# Patient Record
Sex: Female | Born: 2000 | Race: White | Hispanic: No | Marital: Single | Smoking: Current some day smoker
Health system: Southern US, Community
[De-identification: ages and names within clinical notes are randomized; demographics above are authoritative.]

## PROBLEM LIST (undated history)

## (undated) DIAGNOSIS — E119 Type 2 diabetes mellitus without complications: Secondary | ICD-10-CM

## (undated) DIAGNOSIS — F909 Attention-deficit hyperactivity disorder, unspecified type: Secondary | ICD-10-CM

## (undated) DIAGNOSIS — Z9229 Personal history of other drug therapy: Secondary | ICD-10-CM

## (undated) DIAGNOSIS — F419 Anxiety disorder, unspecified: Secondary | ICD-10-CM

## (undated) DIAGNOSIS — Z789 Other specified health status: Secondary | ICD-10-CM

---
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
## 2022-01-26 DIAGNOSIS — K219 Gastro-esophageal reflux disease without esophagitis: Secondary | ICD-10-CM | POA: Insufficient documentation

## 2022-02-06 NOTE — Progress Notes (Signed)
Sleep Medicine   Office Visit  Patient Name: Mariah Chavez DOB: 2000/09/03 MRN [id number]    Chief Complaint: Sleep consult  Brief History:  Milo presents for an initial consult for sleep evaluation and to establish care. Patient states that her sleep quality is poor due to sleeping with a wedge because of acid reflux. The patient's mother reports loud snoring at night. The patient relates the following symptoms: waking tired daily, snoring, morning headaches sometimes, brain fog, lack of focus, EDS, and afternoon fatigue are also present. The patient goes to sleep at 10:30-11:30pm and wakes up at 7:00am.  Sleep quality is worse when outside home environment.  Patient has noted no restlessness of her legs at night that would disrupt her sleep.  The patient  relates vivid dreams as unusual behavior during the night.  The patient relates anxiety, depression, ADHD, and PTSD as  a history of psychiatric problems. She is seen by therapist. The Epworth Sleepiness Score is 12 out of 24 .  The patient relates  Cardiovascular risk factors include: borderline elevated BP, but not on medication for HTN at this time. She reports her mom has OSA.   ROS  General: (-) fever, (-) chills, (-) night sweat Nose and Sinuses: (-) nasal stuffiness or itchiness, (-) postnasal drip, (-) nosebleeds, (-) sinus trouble. Mouth and Throat: (-) sore throat, (-) hoarseness. Neck: (-) swollen glands, (-) enlarged thyroid, (-) neck pain. Respiratory: - cough, - shortness of breath, - wheezing. Neurologic: - numbness, - tingling. Psychiatric: - anxiety, - depression Sleep behavior: -sleep paralysis -hypnogogic hallucinations -dream enactment      +vivid dreams -cataplexy -night terrors -sleep walking   Current Medication: Outpatient Encounter Medications as of 02/07/2022  Medication Sig   escitalopram (LEXAPRO) 10 MG tablet TAKE 1 TABLET BY MOUTH ONCE DAILY FOR 90 DAYS   esomeprazole (NEXIUM) 40 MG capsule Take 40  mg by mouth daily.   Melatonin 10 MG TABS Take by mouth.   traZODone (DESYREL) 100 MG tablet Take 100 mg by mouth at bedtime.   [DISCONTINUED] amoxicillin-clavulanate (AUGMENTIN) 875-125 MG per tablet Take 1 tablet by mouth every 12 (twelve) hours.   [DISCONTINUED] ciprofloxacin-dexamethasone (CIPRODEX) otic suspension Place 4 drops into the left ear 2 (two) times daily.   [DISCONTINUED] HYDROcodone-acetaminophen (NORCO/VICODIN) 5-325 MG per tablet Take 1 tablet by mouth every 6 (six) hours as needed for severe pain.   [DISCONTINUED] lisdexamfetamine (VYVANSE) 60 MG capsule Take 60 mg by mouth every morning.   [DISCONTINUED] metFORMIN (GLUCOPHAGE) 500 MG tablet 250 mg 2 (two) times daily with a meal.   [DISCONTINUED] sertraline (ZOLOFT) 25 MG tablet Take 25 mg by mouth daily.   No facility-administered encounter medications on file as of 02/07/2022.    Surgical History: History reviewed. No pertinent surgical history.  Medical History: Past Medical History:  Diagnosis Date   ADHD (attention deficit hyperactivity disorder)    Anxiety    Diabetes (HCC)    Immunizations up to date in pediatric patient     Family History: Non contributory to the present illness  Social History: Social History   Socioeconomic History   Marital status: Single    Spouse name: Not on file   Number of children: Not on file   Years of education: Not on file   Highest education level: Not on file  Occupational History   Not on file  Tobacco Use   Smoking status: Never   Smokeless tobacco: Not on file  Substance and Sexual Activity  Alcohol use: No   Drug use: Yes    Types: Marijuana   Sexual activity: Not on file  Other Topics Concern   Not on file  Social History Narrative   Not on file   Social Determinants of Health   Financial Resource Strain: Not on file  Food Insecurity: Not on file  Transportation Needs: Not on file  Physical Activity: Not on file  Stress: Not on file  Social  Connections: Not on file  Intimate Partner Violence: Not on file    Vital Signs: Blood pressure (!) 143/82, pulse 99, resp. rate 20, height 5\' 8"  (1.727 m), weight (!) 311 lb (141.1 kg), SpO2 97 %. Body mass index is 47.29 kg/m.   Examination: General Appearance: The patient is well-developed, well-nourished, and in no distress. Neck Circumference: 47cm Skin: Gross inspection of skin unremarkable. Head: normocephalic, no gross deformities. Eyes: no gross deformities noted. ENT: ears appear grossly normal Neurologic: Alert and oriented. No involuntary movements.    STOP BANG RISK ASSESSMENT S (snore) Have you been told that you snore?     YES   T (tired) Are you often tired, fatigued, or sleepy during the day?   YES  O (obstruction) Do you stop breathing, choke, or gasp during sleep? YES   P (pressure) Do you have or are you being treated for high blood pressure? NO   B (BMI) Is your body index greater than 35 kg/m? YES   A (age) Are you 20 years old or older? NO   N (neck) Do you have a neck circumference greater than 16 inches?   YES   G (gender) Are you a female? NO   TOTAL STOP/BANG "YES" ANSWERS 5                                                               A STOP-Bang score of 2 or less is considered low risk, and a score of 5 or more is high risk for having either moderate or severe OSA. For people who score 3 or 4, doctors may need to perform further assessment to determine how likely they are to have OSA.         EPWORTH SLEEPINESS SCALE:  Scale:  (0)= no chance of dozing; (1)= slight chance of dozing; (2)= moderate chance of dozing; (3)= high chance of dozing  Chance  Situtation    Sitting and reading: 3    Watching TV: 1    Sitting Inactive in public: 0    As a passenger in car: 2      Lying down to rest: 3    Sitting and talking: 0    Sitting quielty after lunch: 3    In a car, stopped in traffic: 0   TOTAL SCORE:   12 out of  24    SLEEP STUDIES:  None   LABS: No results found for this or any previous visit (from the past 2160 hour(s)).  Radiology: No results found.  No results found.  No results found.    Assessment and Plan: Patient Active Problem List   Diagnosis Date Noted   Mixed anxiety and depressive disorder 02/07/2022   Gastroesophageal reflux disease 01/26/2022   ADHD 10/01/2020   Insomnia 10/01/2020   Elevated BP without  diagnosis of hypertension 10/01/2020   Type 2 diabetes mellitus without complication, without long-term current use of insulin (HCC) 09/15/2020   Anxiety 09/24/2013     PLAN OSA:   Patient evaluation suggests high risk of sleep disordered breathing due to waking tired daily, snoring, Fhx of OSA, morning headaches sometimes, brain fog, lack of focus, excessive daytime sleepiness, afternoon fatigue, reflux, and elevated BMI.  Patient has comorbid cardiovascular risk factors including: elevated BP in office which could be exacerbated by pathologic sleep-disordered breathing.  Suggest: PSG to assess/treat the patient's sleep disordered breathing. The patient was also counselled on wt loss to optimize sleep health.  1. Hypersomnia Will order PSG  2. Insomnia, unspecified type Continue current medication and f/u with PCP.  3. Elevated BP without diagnosis of hypertension Continue to monitor and follow up with PCP  4. Mixed anxiety and depressive disorder Continue current medication and f/u with PCP and therapist  5. Attention deficit hyperactivity disorder (ADHD), unspecified ADHD type Not currently on medication  6. Gastroesophageal reflux disease, unspecified whether esophagitis present Continue PPI  7. Morbid obesity with BMI of 45.0-49.9, adult (HCC) Obesity Counseling: Had a lengthy discussion regarding patients BMI and weight issues. Patient was instructed on portion control as well as increased activity. Also discussed caloric restrictions with trying  to maintain intake less than 2000 Kcal. Discussions were made in accordance with the 5As of weight management. Simple actions such as not eating late and if able to, taking a walk is suggested.    General Counseling: I have discussed the findings of the evaluation and examination with Charlcie.  I have also discussed any further diagnostic evaluation thatmay be needed or ordered today. Jailey verbalizes understanding of the findings of todays visit. We also reviewed her medications today and discussed drug interactions and side effects including but not limited excessive drowsiness and altered mental states. We also discussed that there is always a risk not just to her but also people around her. she has been encouraged to call the office with any questions or concerns that should arise related to todays visit.  No orders of the defined types were placed in this encounter.       I have personally obtained a history, evaluated the patient, evaluated pertinent data, formulated the assessment and plan and placed orders.  This patient was seen by Lynn Ito, PA-C in collaboration with Dr. Freda Munro as a part of collaborative care agreement.    Yevonne Pax, MD [facility] Diplomate ABMS Pulmonary and Critical Care Medicine Sleep medicine

## 2022-02-07 ENCOUNTER — Ambulatory Visit (INDEPENDENT_AMBULATORY_CARE_PROVIDER_SITE_OTHER): Payer: BC Managed Care – PPO | Admitting: Internal Medicine

## 2022-02-07 VITALS — BP 143/82 | HR 99 | Resp 20 | Ht 68.0 in | Wt 311.0 lb

## 2022-02-07 DIAGNOSIS — G4700 Insomnia, unspecified: Secondary | ICD-10-CM

## 2022-02-07 DIAGNOSIS — K219 Gastro-esophageal reflux disease without esophagitis: Secondary | ICD-10-CM

## 2022-02-07 DIAGNOSIS — F418 Other specified anxiety disorders: Secondary | ICD-10-CM | POA: Diagnosis not present

## 2022-02-07 DIAGNOSIS — R030 Elevated blood-pressure reading, without diagnosis of hypertension: Secondary | ICD-10-CM

## 2022-02-07 DIAGNOSIS — G4710 Hypersomnia, unspecified: Secondary | ICD-10-CM

## 2022-02-07 DIAGNOSIS — F909 Attention-deficit hyperactivity disorder, unspecified type: Secondary | ICD-10-CM

## 2022-02-07 DIAGNOSIS — Z6841 Body Mass Index (BMI) 40.0 and over, adult: Secondary | ICD-10-CM

## 2022-02-07 NOTE — Patient Instructions (Signed)
Hypersomnia Hypersomnia is a condition in which a person feels very tired during the day even though the person gets plenty of sleep at night. A person with this condition may take naps during the day and may find it very difficult to wake up from sleep. Hypersomnia may affect a person's ability to think, concentrate, drive, or remember things. What are the causes? The cause of this condition may not be known. Possible causes include: Taking certain medicines. Using drugs or alcohol. Sleep disorders, such as narcolepsy and sleep apnea. Injury to the head, brain, or spinal cord. Tumors. Certain medical conditions. These include: Depression. Diabetes. Gastroesophageal reflux disease (GERD). An underactive thyroid gland (hypothyroidism). What are the signs or symptoms? The main symptoms of hypersomnia include: Feeling very tired throughout the day, regardless of how much sleep you got the night before. Having trouble waking up. Others may find it difficult to wake you up when you are sleeping. Sleeping for longer and longer periods at a time. Taking naps throughout the day. Other symptoms may include: Feeling restless, anxious, or annoyed. Lacking energy. Having trouble with: Remembering. Speaking. Thinking. Loss of appetite. Seeing, hearing, tasting, smelling, or feeling things that are not real (hallucinations). How is this diagnosed? This condition may be diagnosed based on: Your symptoms and medical history. Your sleeping habits. Your health care provider may ask you to write down your sleeping habits in a daily sleep log, along with any symptoms you have. A series of tests that are done while you sleep (sleep study or polysomnogram). A test that measures how quickly you can fall asleep during the day (daytime nap study or multiple sleep latency test). How is this treated? This condition may be treated by: Following a regular sleep routine. Making lifestyle changes, such as  changing your eating habits, getting regular exercise, and avoiding alcohol or caffeinated beverages. Taking medicines to make you more alert (stimulants) during the day. Treating any underlying medical causes of hypersomnia. Follow these instructions at home: Sleep habits Stick to a routine that includes going to bed and waking up at the same times every day and night. Practice a relaxing bedtime routine. This may include reading, meditation, deep breathing, or taking a warm bath before going to sleep. Exercise regularly as told by your health care provider. However, avoid exercising in the hours right before bedtime. Keep your sleep environment at a cooler temperature, darkened, and quiet. Sleep with pillows and a mattress that are comfortable and supportive. Schedule short 20-minute naps for when you feel sleepiest during the day. Talk with your employer or teachers about your hypersomnia. If possible, adjust your schedule so that: You have a regular daytime work schedule. You can take a scheduled nap during the day. You do not have to work or be active at night. Do not eat a heavy meal for a few hours before bedtime. Eat your meals at about the same times every day. Safety  Do not drive or use machinery if you are sleepy. Ask your health care provider if it is safe for you to drive. Wear a life jacket when swimming or spending time near water. General instructions  Take over-the-counter and prescription medicines only as told by your health care provider. This includes supplements. Avoid drinking alcohol or caffeinated beverages. Keep a sleep log that will help your health care provider manage your condition. This may include information about: What time you go to bed each night. How often you wake up at night. How many hours   you sleep at night. How often and for how long you nap during the day. Any observations from others, such as leg movements during sleep, sleep walking, or  snoring. Keep all follow-up visits. This is important. Contact a health care provider if: You have new symptoms. Your symptoms get worse. Get help right away if: You have thoughts about hurting yourself or someone else. Get help right away if you feel like you may hurt yourself or others, or have thoughts about taking your own life. Go to your nearest emergency room or: Call 911. Call the National Suicide Prevention Lifeline at 1-800-273-8255 or 988. This is open 24 hours a day. Text the Crisis Text Line at 741741. Summary Hypersomnia refers to a condition in which you feel very tired during the day even though you get plenty of sleep at night. A person with this condition may take naps during the day and may find it very difficult to wake up from sleep. Hypersomnia may affect a person's ability to think, concentrate, drive, or remember things. Treatment may include a regular sleep routine and making some lifestyle changes. This information is not intended to replace advice given to you by your health care provider. Make sure you discuss any questions you have with your health care provider. Document Revised: 05/29/2021 Document Reviewed: 05/29/2021 Elsevier Patient Education  2023 Elsevier Inc.  

## 2022-10-15 DIAGNOSIS — N946 Dysmenorrhea, unspecified: Secondary | ICD-10-CM | POA: Insufficient documentation

## 2022-11-12 ENCOUNTER — Ambulatory Visit (INDEPENDENT_AMBULATORY_CARE_PROVIDER_SITE_OTHER): Payer: BC Managed Care – PPO | Admitting: Internal Medicine

## 2022-11-12 VITALS — BP 123/70 | HR 66 | Resp 12 | Ht 68.0 in | Wt 307.2 lb

## 2022-11-12 DIAGNOSIS — Z7189 Other specified counseling: Secondary | ICD-10-CM | POA: Diagnosis not present

## 2022-11-12 DIAGNOSIS — Z6841 Body Mass Index (BMI) 40.0 and over, adult: Secondary | ICD-10-CM | POA: Diagnosis not present

## 2022-11-12 DIAGNOSIS — G4733 Obstructive sleep apnea (adult) (pediatric): Secondary | ICD-10-CM

## 2022-11-12 NOTE — Progress Notes (Unsigned)
[facility] [address] [place], [place] [address]  Pulmonary Sleep Medicine   Office Visit Note  Patient Name: Mariah Chavez DOB: Apr 22, 2001 MRN [id number]    Chief Complaint: Obstructive Sleep Apnea visit  Brief History:  [name] is seen today for a 6 month follow up after set up on CPAP@[id number] .  The patient has an 8 month  history of sleep apnea. Patient is not using PAP nightly.  The patient feels rested after sleeping with PAP.  The patient reports benefit from PAP use. Reported sleepiness is  improved and the Epworth Sleepiness Score is 12 out of 24. The patient does take a nap once a week for about 2-4 hrs. She wears CPAP when she naps. The patient complains of the following: having difficulty using right now due to head cold and also states for a while she didn't have a plug in available to use it.  The compliance download shows 20%  compliance with an average use time of 5 hours. The AHI is 0.9  The patient does not complain of limb movements disrupting sleep. Patient states an issue she has is falling asleep before she puts on the mask. She is starting to set an alarm to help improve this.  ROS  General: (-) fever, (-) chills, (-) night sweat Nose and Sinuses: (-) nasal stuffiness or itchiness, (-) postnasal drip, (-) nosebleeds, (-) sinus trouble. Mouth and Throat: (-) sore throat, (-) hoarseness. Neck: (-) swollen glands, (-) enlarged thyroid, (-) neck pain. Respiratory: - cough, - shortness of breath, - wheezing. Neurologic: - numbness, - tingling. Psychiatric: - anxiety, - depression   Current Medication: Outpatient Encounter Medications as of 11/12/2022  Medication Sig   ZEPBOUND 2.5 MG/0.5ML Pen Inject by subcutaneous route for 28 days.   escitalopram (LEXAPRO) 10 MG tablet TAKE 1 TABLET BY MOUTH ONCE DAILY FOR 90 DAYS   esomeprazole (NEXIUM) 40 MG capsule Take 40 mg by mouth daily.   Melatonin 10 MG TABS Take by mouth.   traZODone (DESYREL) 100  MG tablet Take 100 mg by mouth at bedtime.   No facility-administered encounter medications on file as of 11/12/2022.    Surgical History: History reviewed. No pertinent surgical history.  Medical History: Past Medical History:  Diagnosis Date   ADHD (attention deficit hyperactivity disorder)    Anxiety    Diabetes (HCC)    Immunizations up to date in pediatric patient     Family History: Non contributory to the present illness  Social History: Social History   Socioeconomic History   Marital status: Single    Spouse name: Not on file   Number of children: Not on file   Years of education: Not on file   Highest education level: Not on file  Occupational History   Not on file  Tobacco Use   Smoking status: Some Days    Types: E-cigarettes   Smokeless tobacco: Not on file  Substance and Sexual Activity   Alcohol use: No   Drug use: Yes    Types: Marijuana   Sexual activity: Not on file  Other Topics Concern   Not on file  Social History Narrative   Not on file   Social Determinants of Health   Financial Resource Strain: Not on file  Food Insecurity: Not on file  Transportation Needs: Not on file  Physical Activity: Not on file  Stress: Not on file  Social Connections: Not on file  Intimate Partner Violence: Not on file  Vital Signs: Blood pressure 123/70, pulse 66, resp. rate 12, height 5\' 8"  (1.727 m), weight (!) 307 lb 3.2 oz (139.3 kg), SpO2 97 %. Body mass index is 46.71 kg/m.    Examination: General Appearance: The patient is well-developed, well-nourished, and in no distress. Neck Circumference: 43cm Skin: Gross inspection of skin unremarkable. Head: normocephalic, no gross deformities. Eyes: no gross deformities noted. ENT: ears appear grossly normal Neurologic: Alert and oriented. No involuntary movements.  STOP BANG RISK ASSESSMENT S (snore) Have you been told that you snore?     No   T (tired) Are you often tired, fatigued, or sleepy  during the day?   NO  O (obstruction) Do you stop breathing, choke, or gasp during sleep? NO   P (pressure) Do you have or are you being treated for high blood pressure? NO   B (BMI) Is your body index greater than 35 kg/m? YES   A (age) Are you 14 years old or older? NO   N (neck) Do you have a neck circumference greater than 16 inches?   YES   G (gender) Are you a female? NO   TOTAL STOP/BANG "YES" ANSWERS 2       A STOP-Bang score of 2 or less is considered low risk, and a score of 5 or more is high risk for having either moderate or severe OSA. For people who score 3 or 4, doctors may need to perform further assessment to determine how likely they are to have OSA.         EPWORTH SLEEPINESS SCALE:  Scale:  (0)= no chance of dozing; (1)= slight chance of dozing; (2)= moderate chance of dozing; (3)= high chance of dozing  Chance  Situtation    Sitting and reading: 3    Watching TV: 2    Sitting Inactive in public: 0    As a passenger in car: 2      Lying down to rest: 3    Sitting and talking: 0    Sitting quielty after lunch: 2    In a car, stopped in traffic: 0   TOTAL SCORE:   12 out of 24    SLEEP STUDIES:  HST (03/23/22) RDI 10.1, min SPO2 79%   CPAP COMPLIANCE DATA:  Date Range: 05/16/22-11/06/2022  Average Daily Use: 5 hours  Median Use: 5 hrs 46 min  Compliance for > 4 Hours: 20% days  AHI: 0.9 respiratory events per hour  Days Used: 49/175  Mask Leak: 17.4  95th Percentile Pressure: 10         LABS: No results found for this or any previous visit (from the past 2160 hour(s)).  Radiology: No results found.  No results found.  No results found.    Assessment and Plan: Patient Active Problem List   Diagnosis Date Noted   Morbid obesity (HCC) 11/12/2022   Dysmenorrhea 10/15/2022   Mixed anxiety and depressive disorder 02/07/2022   Gastroesophageal reflux disease 01/26/2022   ADHD 10/01/2020   Insomnia 10/01/2020    Elevated BP without diagnosis of hypertension 10/01/2020   Type 2 diabetes mellitus without complication, without long-term current use of insulin (HCC) 09/15/2020   Elevated C-reactive protein (CRP) 02/19/2014   Anxiety 09/24/2013   1. OSA (obstructive sleep apnea) The patient does tolerate PAP and reports  benefit from PAP use. Her compliance is poor and we discussed this at length. The patient was reminded how to clean equipment and advised to replace supplies routinely. The  patient was also counselled on weight loss. The compliance is poor. The AHI is 0.9.   OSA on cpap- controlled. Increase compliance with pap. CPAP continues to be medically necessary to treat this patient's OSA. F/u 67m    2. CPAP use counseling CPAP Counseling: had a lengthy discussion with the patient regarding the importance of PAP therapy in management of the sleep apnea. Patient appears to understand the risk factor reduction and also understands the risks associated with untreated sleep apnea. Patient will try to make a good faith effort to remain compliant with therapy. Also instructed the patient on proper cleaning of the device including the water must be changed daily if possible and use of distilled water is preferred. Patient understands that the machine should be regularly cleaned with appropriate recommended cleaning solutions that do not damage the PAP machine for example given white vinegar and water rinses. Other methods such as ozone treatment may not be as good as these simple methods to achieve cleaning.   3. Morbid obesity with BMI of 45.0-49.9, adult (HCC) Obesity Counseling: Had a lengthy discussion regarding patients BMI and weight issues. Patient was instructed on portion control as well as increased activity. Also discussed caloric restrictions with trying to maintain intake less than 2000 Kcal. Discussions were made in accordance with the 5As of weight management. Simple actions such as not eating late  and if able to, taking a walk is suggested.     General Counseling: I have discussed the findings of the evaluation and examination with Haillee.  I have also discussed any further diagnostic evaluation thatmay be needed or ordered today. Lexani verbalizes understanding of the findings of todays visit. We also reviewed her medications today and discussed drug interactions and side effects including but not limited excessive drowsiness and altered mental states. We also discussed that there is always a risk not just to her but also people around her. she has been encouraged to call the office with any questions or concerns that should arise related to todays visit.  No orders of the defined types were placed in this encounter.       I have personally obtained a history, examined the patient, evaluated laboratory and imaging results, formulated the assessment and plan and placed orders. This patient was seen today by Emmaline Kluver, PA-C in collaboration with Dr. Freda Munro.   Yevonne Pax, MD [facility] Diplomate ABMS Pulmonary Critical Care Medicine and Sleep Medicine

## 2022-11-12 NOTE — Patient Instructions (Signed)
